# Patient Record
Sex: Male | Born: 1985 | Race: White | Hispanic: No | Marital: Single | Smoking: Current every day smoker
Health system: Southern US, Community
[De-identification: ages and names within clinical notes are randomized; demographics above are authoritative.]

## PROBLEM LIST (undated history)

## (undated) DIAGNOSIS — F445 Conversion disorder with seizures or convulsions: Secondary | ICD-10-CM

## (undated) DIAGNOSIS — R569 Unspecified convulsions: Secondary | ICD-10-CM

## (undated) HISTORY — PX: OTHER SURGICAL HISTORY: SHX169

---
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
## 2007-02-22 ENCOUNTER — Emergency Department (HOSPITAL_COMMUNITY): Admission: EM | Admit: 2007-02-22 | Discharge: 2007-02-22 | Payer: Self-pay | Admitting: Emergency Medicine

## 2007-03-05 ENCOUNTER — Emergency Department (HOSPITAL_COMMUNITY): Admission: EM | Admit: 2007-03-05 | Discharge: 2007-03-06 | Payer: Self-pay | Admitting: Emergency Medicine

## 2007-03-12 ENCOUNTER — Emergency Department (HOSPITAL_COMMUNITY): Admission: EM | Admit: 2007-03-12 | Discharge: 2007-03-12 | Payer: Self-pay | Admitting: Emergency Medicine

## 2007-04-24 ENCOUNTER — Emergency Department (HOSPITAL_COMMUNITY): Admission: EM | Admit: 2007-04-24 | Discharge: 2007-04-24 | Payer: Self-pay | Admitting: Emergency Medicine

## 2009-06-28 ENCOUNTER — Emergency Department (HOSPITAL_COMMUNITY): Admission: EM | Admit: 2009-06-28 | Discharge: 2009-06-28 | Payer: Self-pay | Admitting: Emergency Medicine

## 2009-07-13 ENCOUNTER — Emergency Department: Payer: Self-pay | Admitting: Emergency Medicine

## 2009-08-29 ENCOUNTER — Emergency Department (HOSPITAL_COMMUNITY): Admission: EM | Admit: 2009-08-29 | Discharge: 2009-08-29 | Payer: Self-pay | Admitting: Emergency Medicine

## 2009-09-15 ENCOUNTER — Emergency Department (HOSPITAL_COMMUNITY): Admission: EM | Admit: 2009-09-15 | Discharge: 2009-09-16 | Payer: Self-pay | Admitting: Emergency Medicine

## 2009-09-16 ENCOUNTER — Emergency Department (HOSPITAL_COMMUNITY): Admission: EM | Admit: 2009-09-16 | Discharge: 2009-09-16 | Payer: Self-pay | Admitting: Emergency Medicine

## 2009-10-11 ENCOUNTER — Emergency Department: Payer: Self-pay | Admitting: Emergency Medicine

## 2009-10-12 ENCOUNTER — Emergency Department: Payer: Self-pay | Admitting: Emergency Medicine

## 2009-10-20 ENCOUNTER — Emergency Department (HOSPITAL_COMMUNITY): Admission: EM | Admit: 2009-10-20 | Discharge: 2009-10-20 | Payer: Self-pay | Admitting: Emergency Medicine

## 2009-10-22 ENCOUNTER — Emergency Department (HOSPITAL_COMMUNITY): Admission: EM | Admit: 2009-10-22 | Discharge: 2009-10-22 | Payer: Self-pay | Admitting: Emergency Medicine

## 2009-12-24 ENCOUNTER — Emergency Department (HOSPITAL_COMMUNITY): Admission: EM | Admit: 2009-12-24 | Discharge: 2009-12-24 | Payer: Self-pay | Admitting: Emergency Medicine

## 2010-01-07 ENCOUNTER — Emergency Department (HOSPITAL_COMMUNITY): Admission: EM | Admit: 2010-01-07 | Discharge: 2010-01-07 | Payer: Self-pay | Admitting: Emergency Medicine

## 2010-01-09 ENCOUNTER — Emergency Department: Payer: Self-pay

## 2010-01-12 ENCOUNTER — Emergency Department (HOSPITAL_COMMUNITY): Admission: EM | Admit: 2010-01-12 | Discharge: 2010-01-12 | Payer: Self-pay | Admitting: Emergency Medicine

## 2010-01-13 ENCOUNTER — Emergency Department (HOSPITAL_COMMUNITY): Admission: EM | Admit: 2010-01-13 | Discharge: 2010-01-13 | Payer: Self-pay | Admitting: Emergency Medicine

## 2010-01-17 ENCOUNTER — Ambulatory Visit: Payer: Self-pay | Admitting: Diagnostic Radiology

## 2010-01-17 ENCOUNTER — Emergency Department (HOSPITAL_BASED_OUTPATIENT_CLINIC_OR_DEPARTMENT_OTHER): Admission: EM | Admit: 2010-01-17 | Discharge: 2010-01-17 | Payer: Self-pay | Admitting: Emergency Medicine

## 2010-01-31 ENCOUNTER — Emergency Department (HOSPITAL_COMMUNITY): Admission: EM | Admit: 2010-01-31 | Discharge: 2010-01-31 | Payer: Self-pay | Admitting: Emergency Medicine

## 2010-02-07 ENCOUNTER — Emergency Department (HOSPITAL_COMMUNITY): Admission: EM | Admit: 2010-02-07 | Discharge: 2010-02-07 | Payer: Self-pay | Admitting: Emergency Medicine

## 2010-02-22 ENCOUNTER — Emergency Department (HOSPITAL_COMMUNITY): Admission: EM | Admit: 2010-02-22 | Discharge: 2010-02-22 | Payer: Self-pay | Admitting: Emergency Medicine

## 2010-03-11 ENCOUNTER — Emergency Department (HOSPITAL_COMMUNITY): Admission: EM | Admit: 2010-03-11 | Discharge: 2010-03-11 | Payer: Self-pay | Admitting: Emergency Medicine

## 2010-03-22 ENCOUNTER — Emergency Department (HOSPITAL_COMMUNITY): Admission: EM | Admit: 2010-03-22 | Discharge: 2010-03-22 | Payer: Self-pay | Admitting: Emergency Medicine

## 2010-04-27 ENCOUNTER — Emergency Department (HOSPITAL_COMMUNITY): Admission: EM | Admit: 2010-04-27 | Discharge: 2010-04-27 | Payer: Self-pay | Admitting: Emergency Medicine

## 2010-05-06 ENCOUNTER — Emergency Department (HOSPITAL_COMMUNITY): Admission: EM | Admit: 2010-05-06 | Discharge: 2010-05-06 | Payer: Self-pay | Admitting: Emergency Medicine

## 2010-05-31 ENCOUNTER — Emergency Department: Payer: Self-pay | Admitting: Emergency Medicine

## 2010-08-17 IMAGING — CR DG SHOULDER 2+V*L*
3 series · 3 of 3 positions shown · non-contrast
Comparison: Chest left shoulder films 08/29/2009 and 06/28/2009.

CLINICAL DATA: Status post fall.  Unable to move arm.  Pain.

LEFT SHOULDER - 2+ VIEW

[w shoulder ap internal left *]
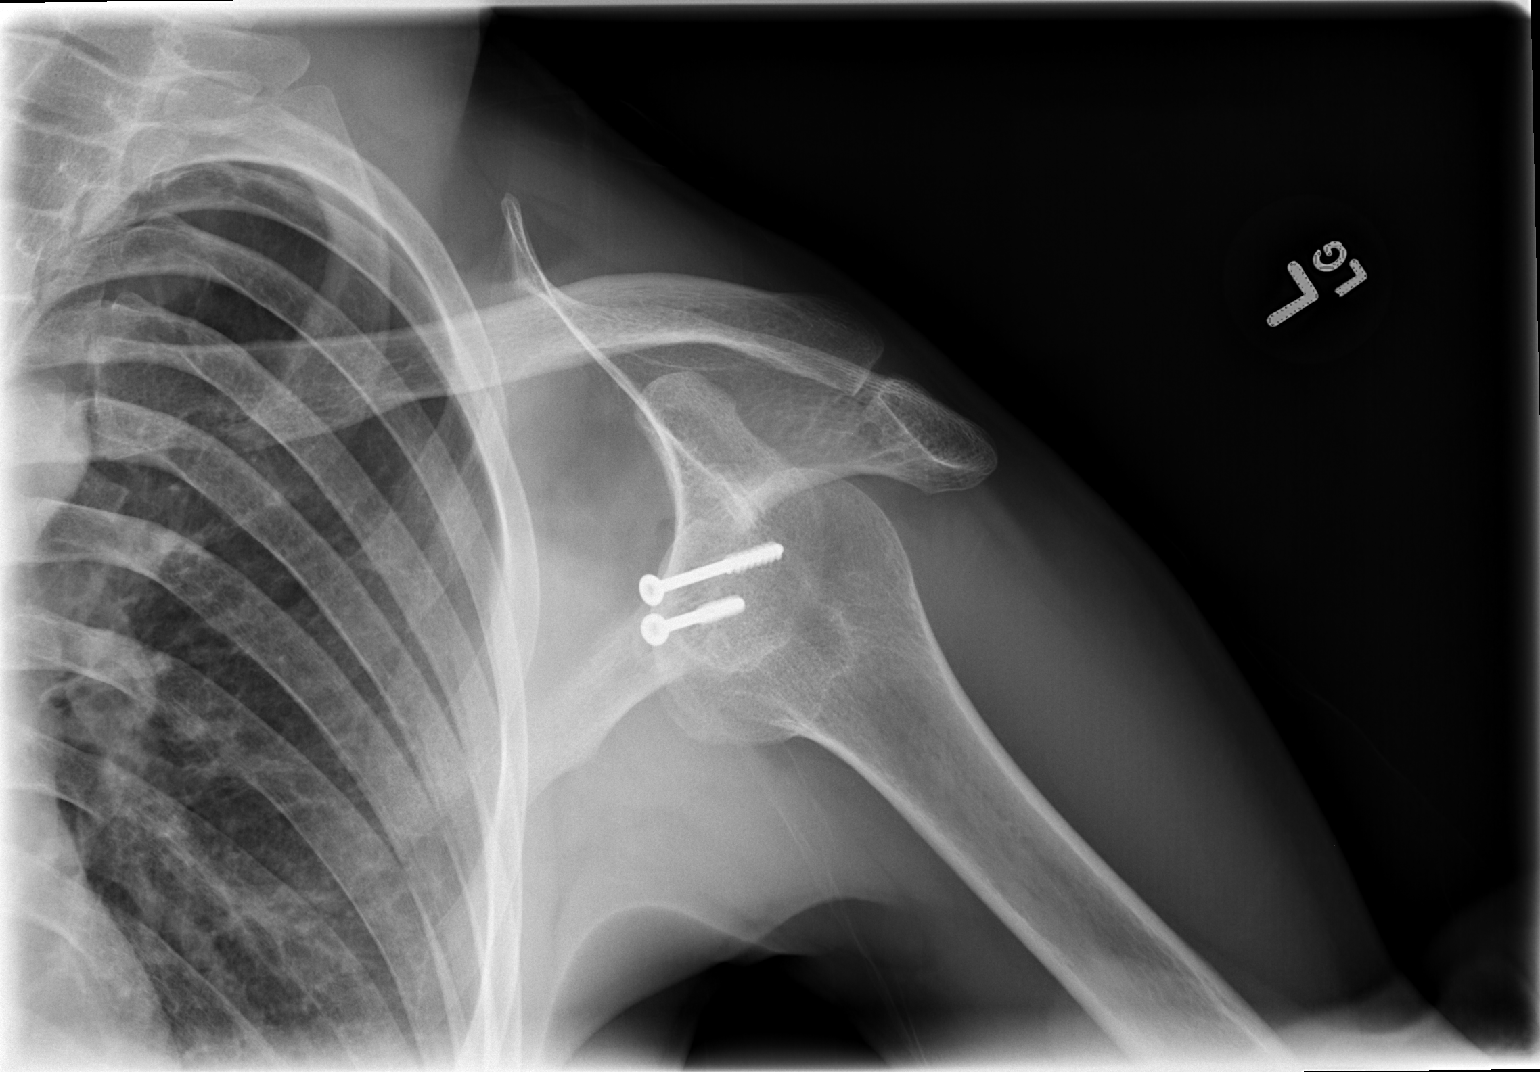

[w shoulder ap external left (1 of 2)]
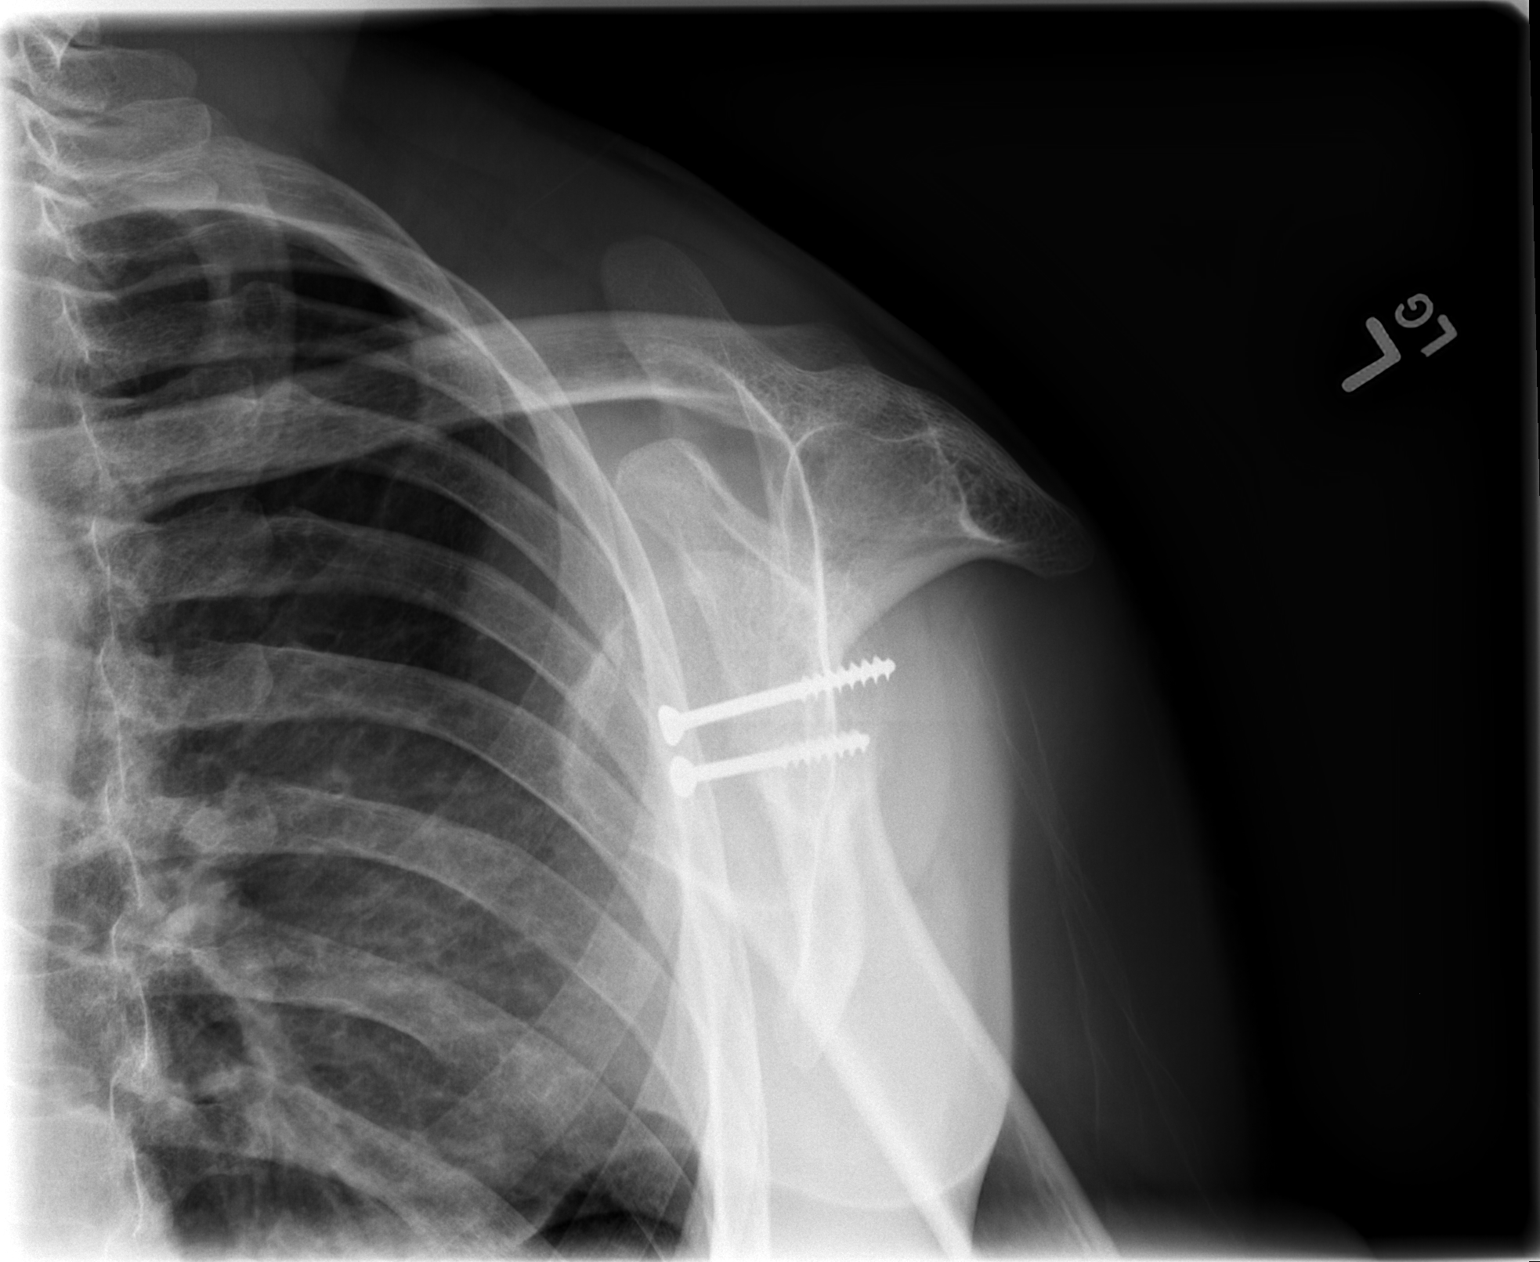

[w shoulder ap external left (2 of 2)]
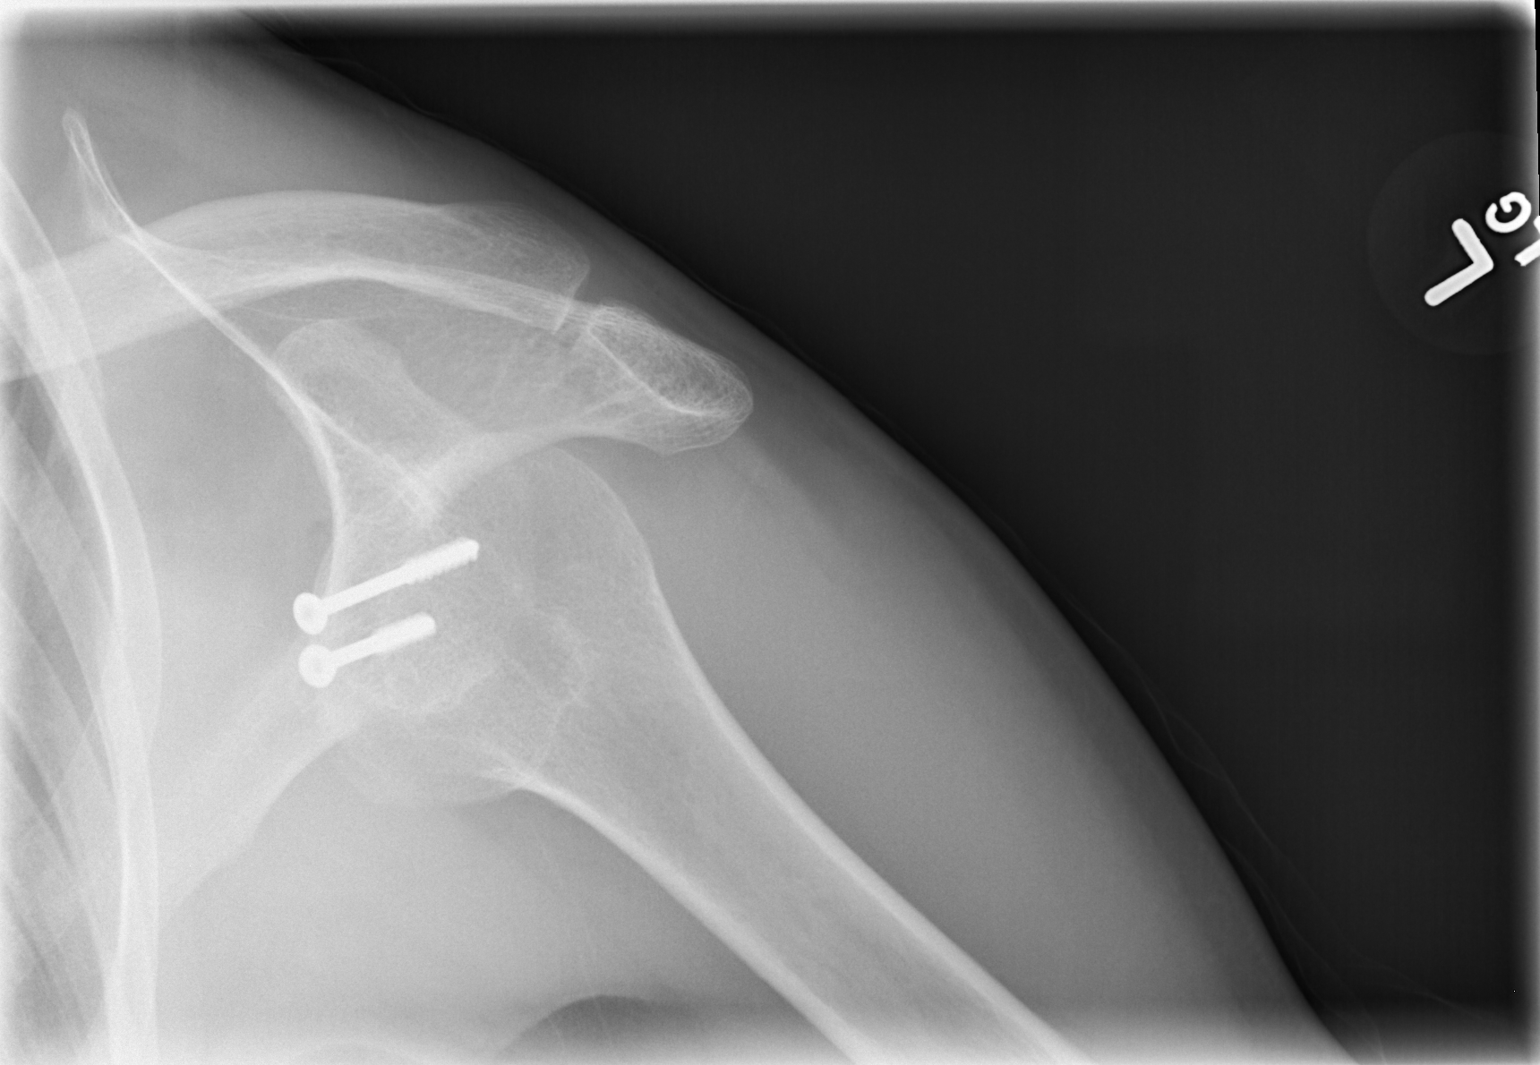

[3 of 3 positions shown; findings below may reference images not displayed]

FINDINGS: The left humerus is dislocated anteriorly, similar to the
prior films.  No acute fracture is evident.  A previously noted
Hill-Sachs fracture is not well seen on these views.  Postoperative
changes are again noted.
IMPRESSION: 1.  Anterior dislocation.

## 2010-08-31 ENCOUNTER — Emergency Department (HOSPITAL_COMMUNITY): Admission: EM | Admit: 2010-08-31 | Discharge: 2010-02-28 | Payer: Self-pay | Admitting: Emergency Medicine

## 2010-08-31 ENCOUNTER — Emergency Department (HOSPITAL_COMMUNITY): Admission: EM | Admit: 2010-08-31 | Discharge: 2009-11-21 | Payer: Self-pay | Admitting: Internal Medicine

## 2010-12-08 IMAGING — CR DG SHOULDER 2+V*L*
4 series · 4 of 4 positions shown · non-contrast
Comparison: Left shoulder x-rays 12/24/2009, 11/21/2009,
10/22/2009, 10/20/2009, 09/16/2009, 06/28/2009, and 04/24/2007.

CLINICAL DATA: Fell out of bed, injured left shoulder.

LEFT SHOULDER - 2+ VIEW 01/07/2010:

[w shoulder ap internal left]
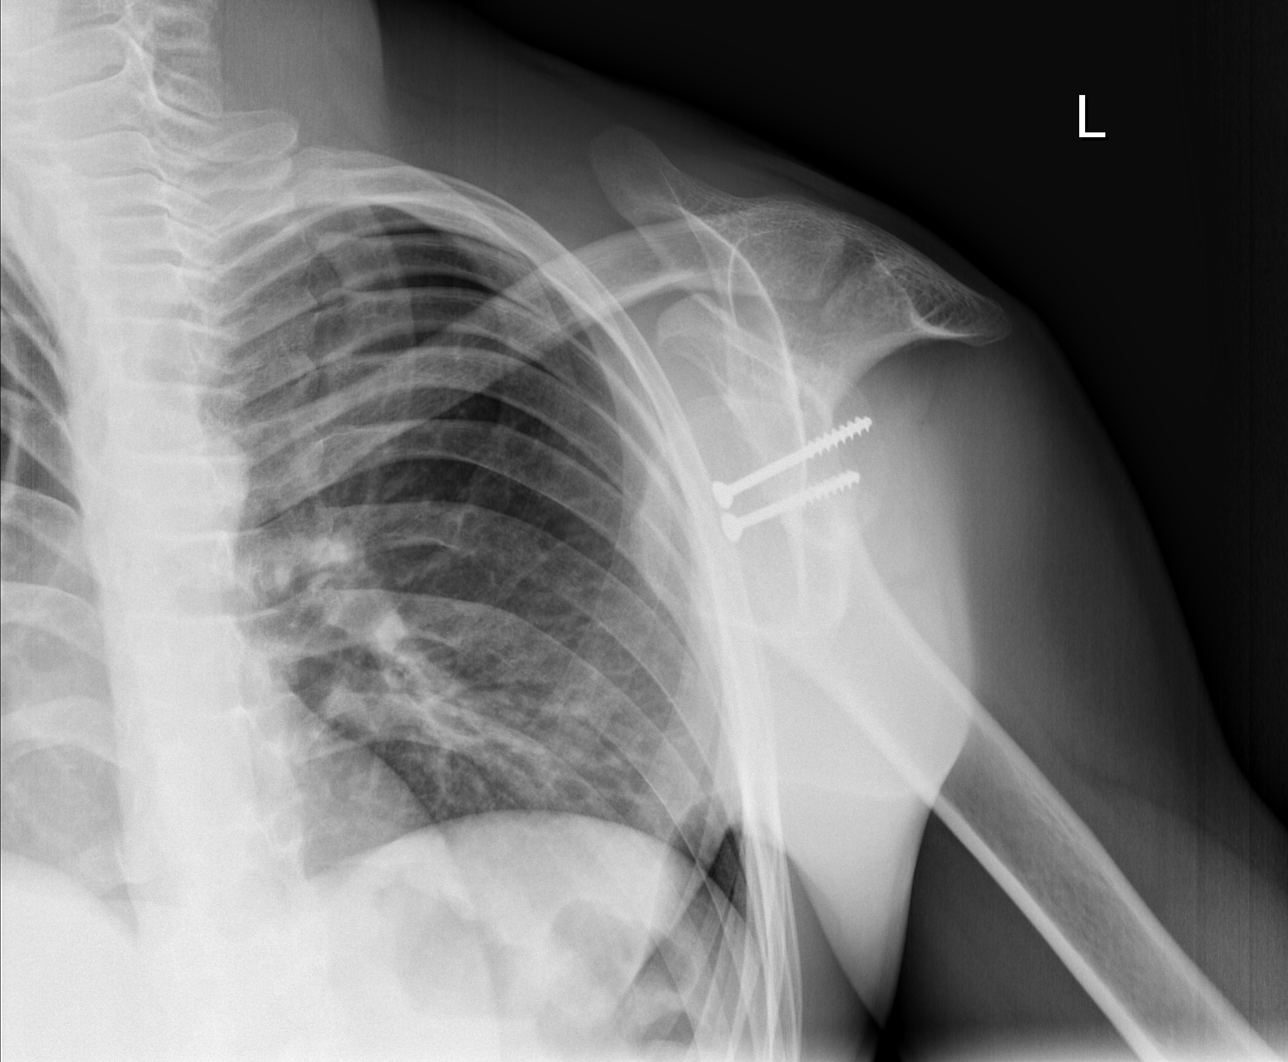

[w shoulder ap external left]
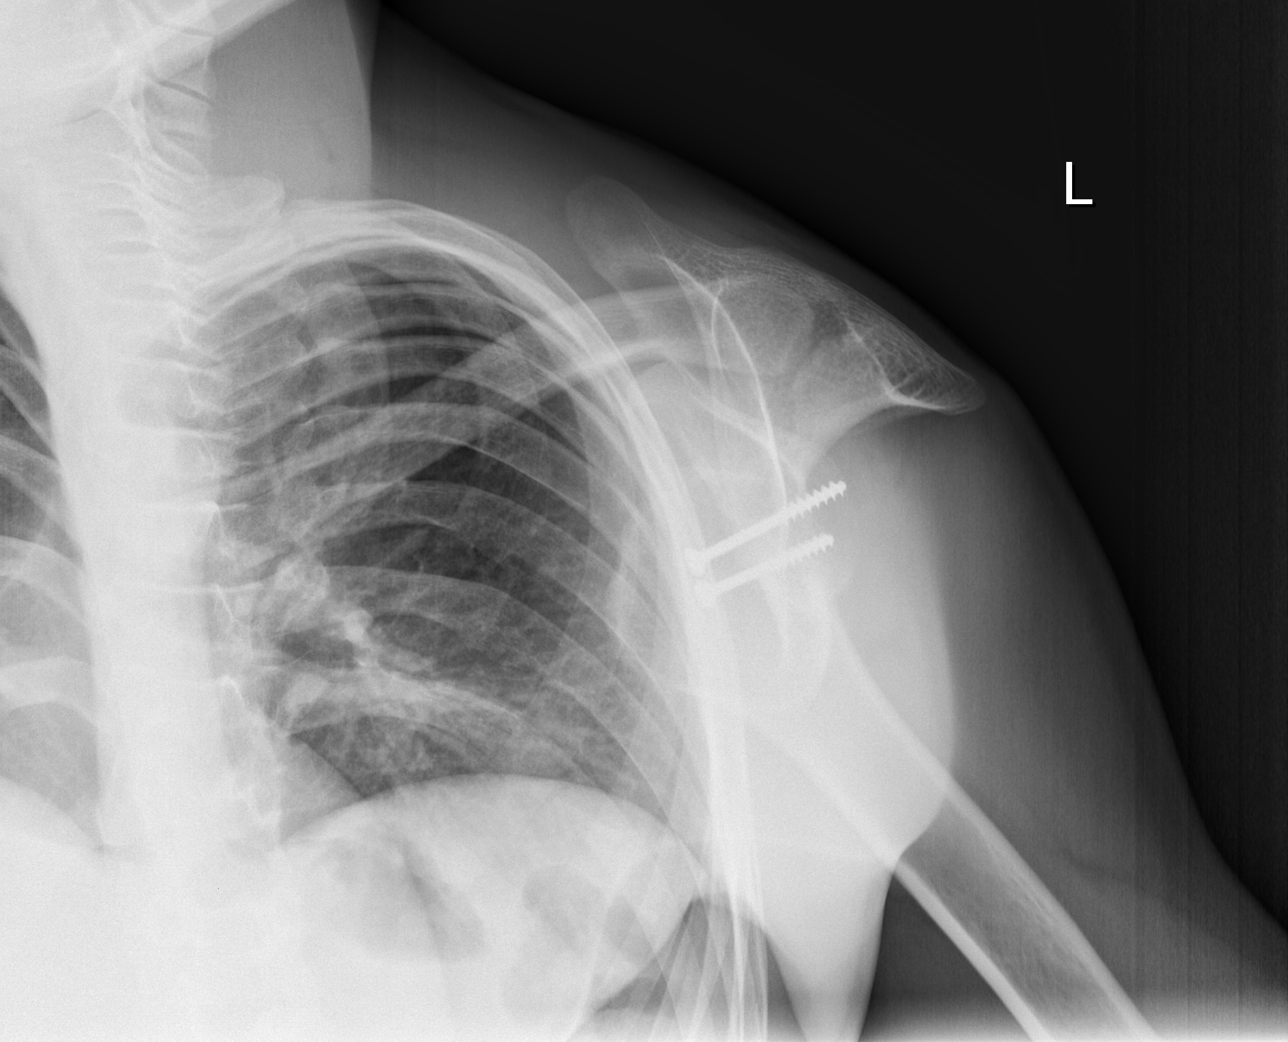

[w shoulder y view left]
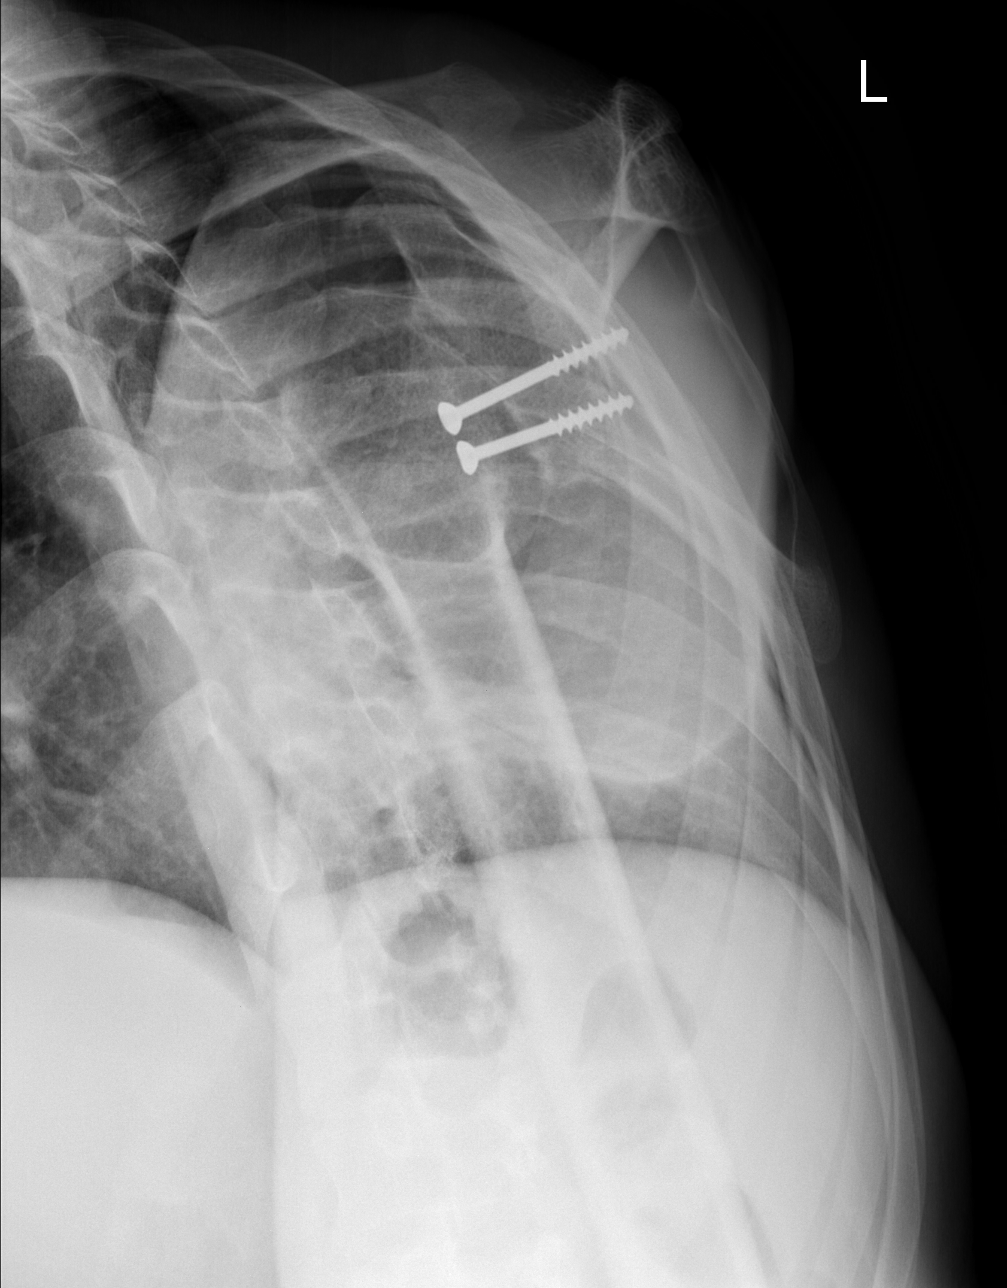

[x shoulder axillary left]
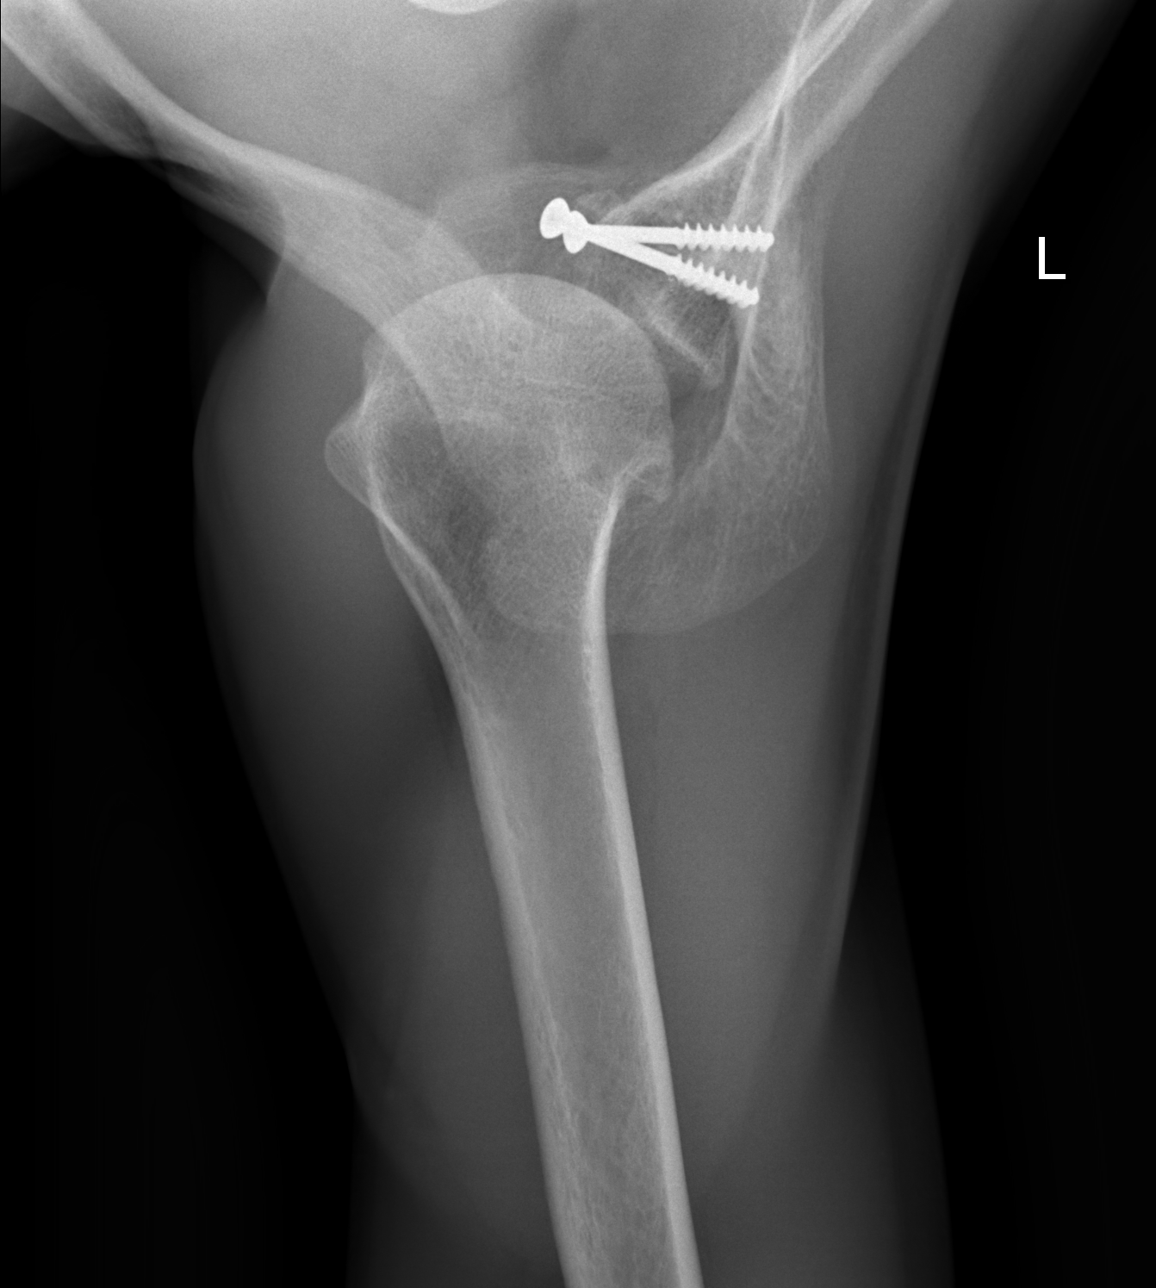

[4 of 4 positions shown; findings below may reference images not displayed]

FINDINGS: Recurrent anterior glenohumeral dislocation.  Compression
screws within the scapula at the glenoid.  No visible acute
fractures.  Acromioclavicular joint intact.
IMPRESSION: Recurrent anterior glenohumeral dislocation.  No visible acute
fractures.

## 2010-12-08 IMAGING — CR DG SHOULDER 1V*L*
2 series · 2 of 2 positions shown · non-contrast
Comparison: Left shoulder x-rays earlier this morning 2525 hours.

CLINICAL DATA: Post-reduction of the left glenohumeral joint.

PORTABLE LEFT SHOULDER 2 VIEWS [DATE]/3511 5455 hours:

[view not recorded (1 of 2)]
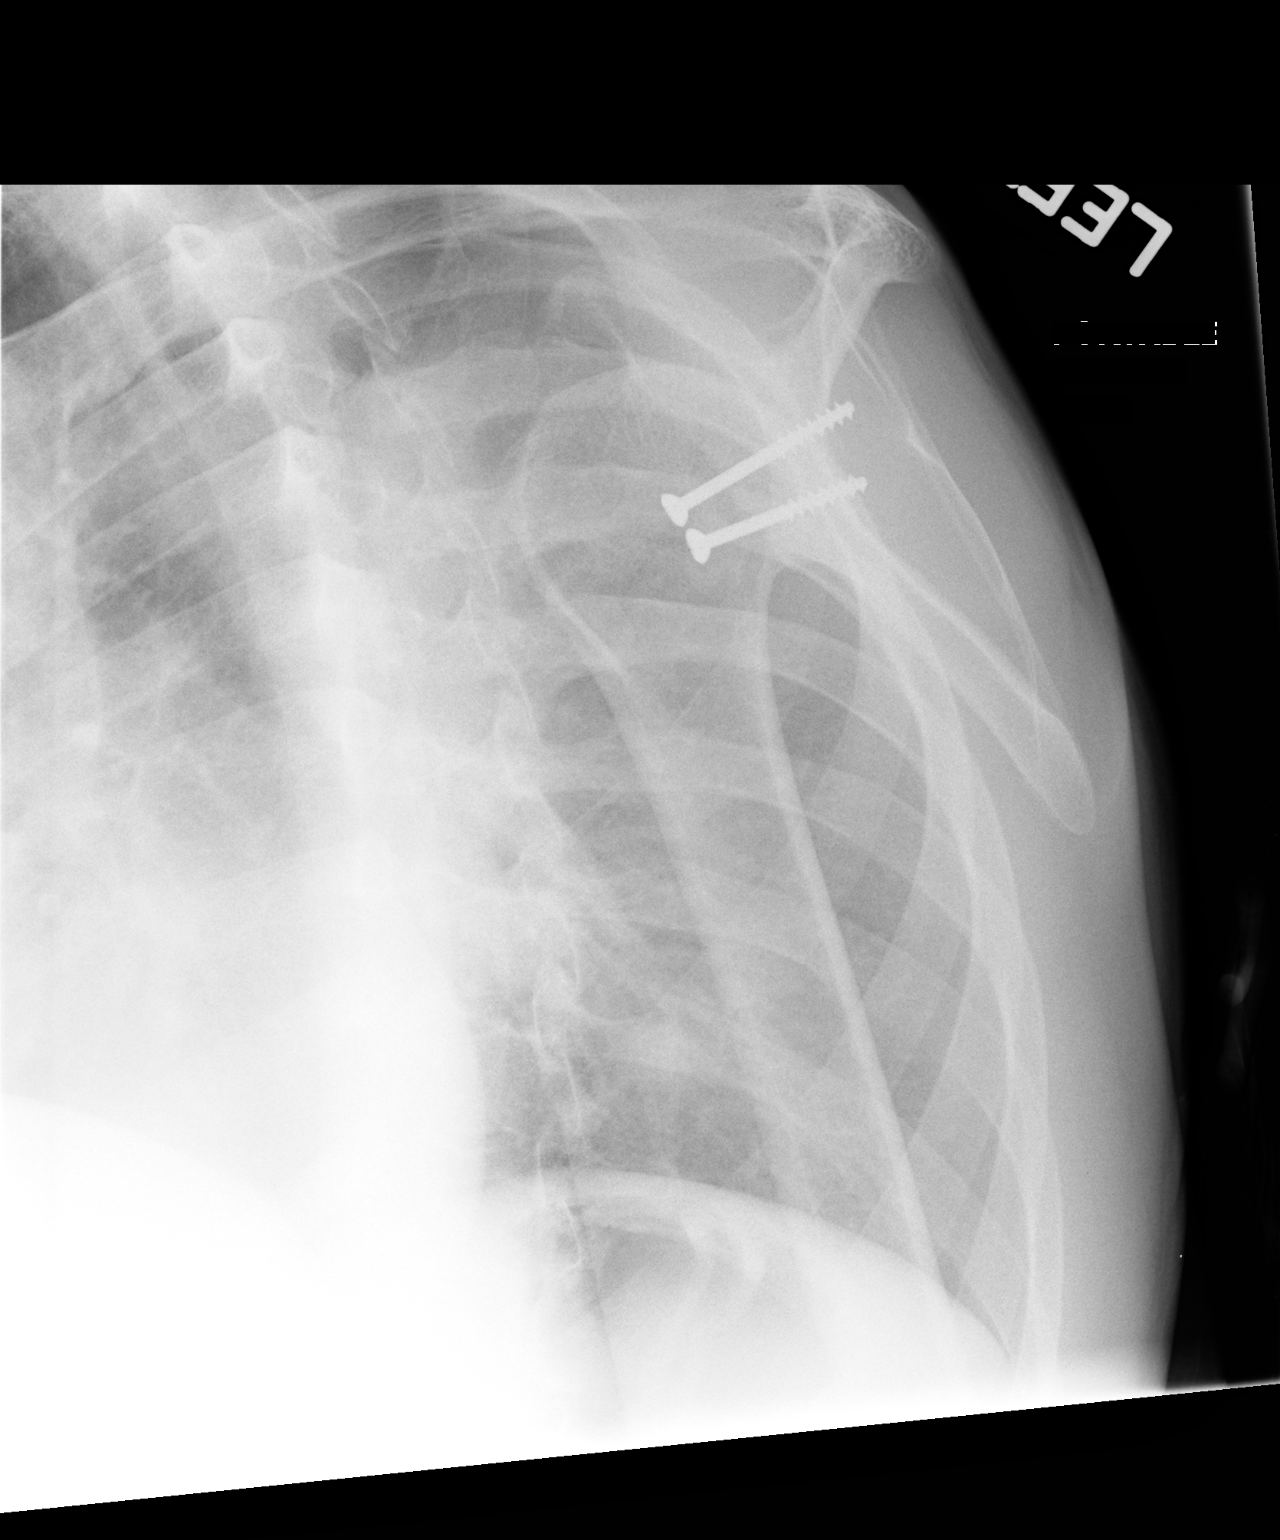

[view not recorded (2 of 2)]
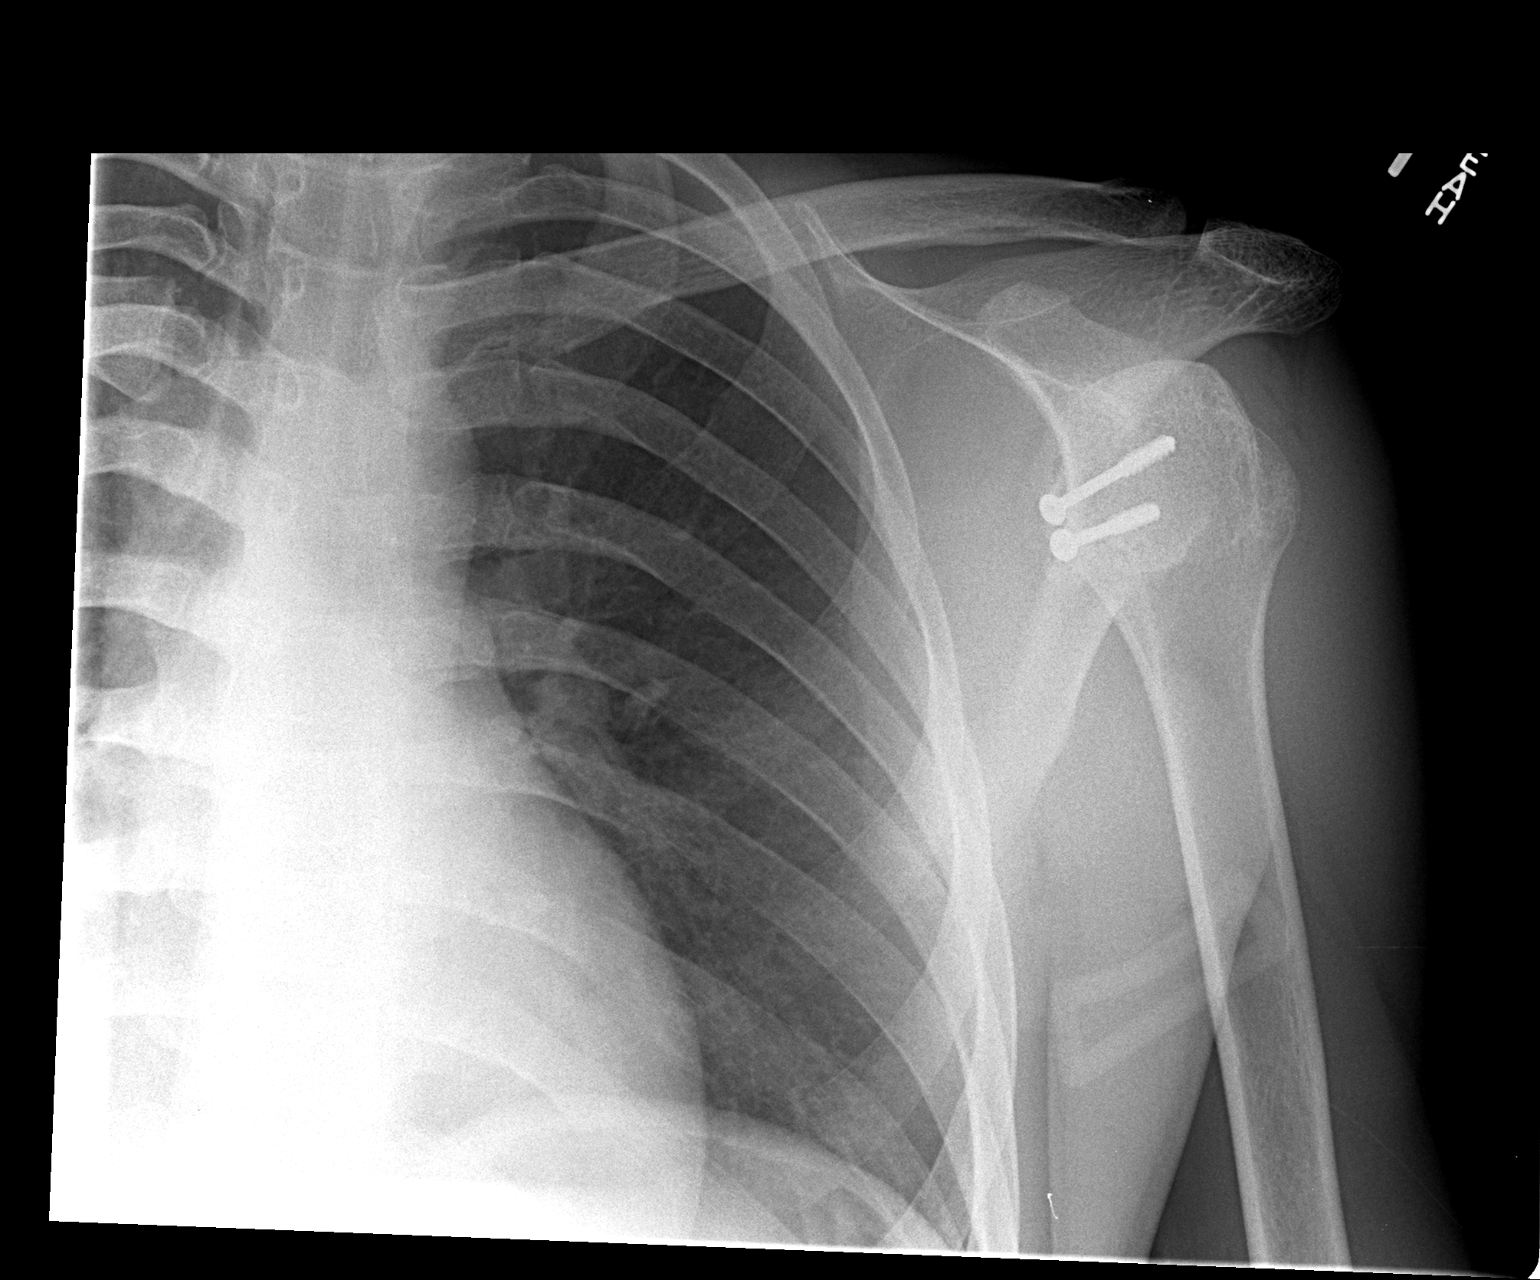

[2 of 2 positions shown; findings below may reference images not displayed]

FINDINGS: Anatomic alignment of the glenohumeral joint post-
reduction.  Hill-Sachs deformity involving the humeral head,
unchanged from the more remote examinations compared earlier.
IMPRESSION: Anatomic alignment of the left glenohumeral joint post-reduction.

## 2010-12-10 LAB — RAPID URINE DRUG SCREEN, HOSP PERFORMED
Cocaine: NOT DETECTED
Opiates: NOT DETECTED

## 2016-08-07 ENCOUNTER — Emergency Department (HOSPITAL_COMMUNITY)
Admission: EM | Admit: 2016-08-07 | Discharge: 2016-08-07 | Disposition: A | Discharge status: Home / Self Care | Payer: Self-pay | Attending: Physician Assistant | Admitting: Physician Assistant

## 2016-08-07 ENCOUNTER — Encounter (HOSPITAL_COMMUNITY): Payer: Self-pay | Admitting: Emergency Medicine

## 2016-08-07 ENCOUNTER — Emergency Department (HOSPITAL_COMMUNITY): Payer: Self-pay

## 2016-08-07 DIAGNOSIS — Y929 Unspecified place or not applicable: Secondary | ICD-10-CM | POA: Insufficient documentation

## 2016-08-07 DIAGNOSIS — M25512 Pain in left shoulder: Secondary | ICD-10-CM | POA: Insufficient documentation

## 2016-08-07 DIAGNOSIS — F17210 Nicotine dependence, cigarettes, uncomplicated: Secondary | ICD-10-CM | POA: Insufficient documentation

## 2016-08-07 DIAGNOSIS — W1839XA Other fall on same level, initial encounter: Secondary | ICD-10-CM | POA: Insufficient documentation

## 2016-08-07 DIAGNOSIS — Y9389 Activity, other specified: Secondary | ICD-10-CM | POA: Insufficient documentation

## 2016-08-07 DIAGNOSIS — Y999 Unspecified external cause status: Secondary | ICD-10-CM | POA: Insufficient documentation

## 2016-08-07 HISTORY — DX: Unspecified convulsions: R56.9

## 2016-08-07 HISTORY — DX: Conversion disorder with seizures or convulsions: F44.5

## 2016-08-07 MED ORDER — HYDROMORPHONE HCL 1 MG/ML IJ SOLN: 1.0000 mg | Freq: Once | INTRAMUSCULAR | Status: DC

## 2016-08-07 MED ORDER — HYDROCODONE-ACETAMINOPHEN 5-325 MG PO TABS
1.0000 | ORAL_TABLET | Freq: Once | ORAL | Status: AC
  Administered 2016-08-07: 1 via ORAL
  Filled 2016-08-07: qty 1

## 2016-08-07 NOTE — Discharge Instructions (Signed)
X-ray was normal today. Please wear the sling for comfort. He may take Tylenol and ibuprofen for pain. Follow-up with orthopedics if symptoms persist.

## 2016-08-07 NOTE — ED Triage Notes (Signed)
[facility] pt was standing on a box and fell off, landing on his left side; pt has hx of left shoulder dislocations and had surgery on shoulder in 2010; deformity noted

## 2016-08-07 NOTE — ED Provider Notes (Signed)
[facility]-EMERGENCY DEPT Provider Note   CSN: [id number] Arrival date & time: 08/07/16  2035     History   Chief Complaint Chief Complaint  Patient presents with  . Shoulder Injury    HPI Eric Church is a 30 y.o. male.  30 year old Caucasian male with a past medical history of pseudoseizures and chronic left shoulder pain that presents to the ED today with left shoulder pain. [facility]. Patient states he was standing on a box and fell off landing on his left shoulder pta. The patient had surgery on his frequent dislocations in 2010. [facility] for fall and left shoulder that showed chronic changes without any acute dislocation or fracture. The patient endorses pain at this time and left shoulder. States the pain was acute in onset. Is gradually worsening. Moving makes the pain worse. Nothing makes the pain better. He has not tried anything for the pain prior to arrival. Denies hitting his head. Denies loc. Patient denies any other symptoms or complaints at this time. Patient states he has chronic left shoulder problems and had dislocations and had surgery back in 2010. Since that time he had failure of one of the screws and has a broken screw in a shoulder. Patient relates severe pain at this point time and worse with range of motion      Past Medical History:  Diagnosis Date  . Pseudoseizures     There are no active problems to display for this patient.   Past Surgical History:  Procedure Laterality Date  . left shoulder surgery         Home Medications    Prior to Admission medications   Medication Sig Start Date End Date Taking? Authorizing Provider  cloNIDine (CATAPRES) 0.1 MG tablet Take 0.1 mg by mouth daily. Started 11/10 for 4 days   Yes Historical Provider, MD  ibuprofen (ADVIL,MOTRIN) 400 MG tablet Take 400 mg by mouth 2 (two) times daily. Started 11/12 for  7 days   Yes Historical Provider, MD  levETIRAcetam (KEPPRA) 500 MG tablet Take 500 mg by mouth 2 (two) times daily. Started 11/09 for 14 days   Yes Historical Provider, MD  mirtazapine (REMERON) 15 MG tablet Take 7.5 mg by mouth every evening.    Yes Historical Provider, MD  thiamine (VITAMIN B-1) 100 MG tablet Take 100 mg by mouth daily.   Yes Historical Provider, MD    Family History No family history on file.  Social History Social History  Substance Use Topics  . Smoking status: Current Every Day Smoker    Packs/day: 1.00    Types: Cigarettes  . Smokeless tobacco: Former NeurosurgeonUser  . Alcohol use Yes     Comment: 2 drinks per week     Allergies   Morphine and related   Review of Systems Review of Systems  Constitutional: Negative for chills and fever.  HENT: Negative for congestion, ear pain, rhinorrhea and sore throat.   Eyes: Negative for pain and discharge.  Respiratory: Negative for cough and shortness of breath.   Cardiovascular: Negative for chest pain and palpitations.  Gastrointestinal: Negative for abdominal pain, diarrhea, nausea and vomiting.  Genitourinary: Negative for flank pain, frequency, hematuria and urgency.  Musculoskeletal: Positive for arthralgias. Negative for myalgias and neck pain.  Skin: Negative.   Neurological: Negative for dizziness, syncope, weakness, light-headedness, numbness and headaches.  All other systems reviewed and are negative.  Physical Exam Updated Vital Signs BP (!) 134/109 (BP Location: Right Arm)   Pulse 80   Temp 98.2 F (36.8 C) (Oral)   Resp 17   Ht 5\' 7"  (1.702 m)   Wt 63.5 kg   SpO2 100%   BMI 21.93 kg/m   Physical Exam  Constitutional: He appears well-developed and well-nourished. No distress.  HENT:  Head: Normocephalic and atraumatic.  Mouth/Throat: Oropharynx is clear and moist.  Eyes: Conjunctivae are normal. Right eye exhibits no discharge. Left eye exhibits no discharge. No scleral icterus.  Neck:  Normal range of motion. Neck supple. No thyromegaly present.  Full ROM, no midline tenderness.  Cardiovascular: Normal rate, regular rhythm, normal heart sounds and intact distal pulses.   Pulmonary/Chest: Effort normal and breath sounds normal.  Abdominal: Soft. Bowel sounds are normal. He exhibits no distension. There is no tenderness.  Musculoskeletal:       Left shoulder: He exhibits decreased range of motion (Secondary to pain), tenderness, bony tenderness, deformity (chronic appearing) and pain. He exhibits no swelling, no effusion, no crepitus, normal pulse and normal strength.  Clear to assess range of motion due to pain. Radial pulses are 2+. Sensation intact. Cap refill normal. Surgical scar noted to left shoulder. Deformity is noted.  Lymphadenopathy:    He has no cervical adenopathy.  Neurological: He is alert.  Skin: Skin is warm and dry. Capillary refill takes less than 2 seconds.  Nursing note and vitals reviewed.    ED Treatments / Results  Labs (all labs ordered are listed, but only abnormal results are displayed) Labs Reviewed - No data to display  EKG  EKG Interpretation None       Radiology Dg Shoulder Left  Result Date: 08/07/2016 CLINICAL DATA:  Status post fall off box, landing on left side. Left shoulder pain. Initial encounter. EXAM: LEFT SHOULDER - 2+ VIEW COMPARISON:  Left shoulder radiographs performed 08/02/2016 FINDINGS: There is no evidence of fracture or dislocation. There appears to mild anterior subluxation of the left humeral head. There is mild chronic flattening of the left humeral head, with osteophyte formation. There is mild chronic deformity of the left glenoid, with associated postoperative change. There is a chronic fracture of one of the screws at the left glenoid. The left humeral head is seated within the glenoid fossa. The acromioclavicular joint is unremarkable in appearance. No significant soft tissue abnormalities are seen. The  visualized portions of the left lung are clear. IMPRESSION: 1. Apparent mild chronic anterior subluxation of the left humeral head. No evidence of fracture or dislocation. 2. Mild chronic flattening of the left humeral head, with osteophyte formation. Mild chronic deformity of the left glenoid. Chronic stable fracture of one of the screws of the left glenoid. Electronically Signed   By: Roanna RaiderJeffery  Chang M.D.   On: 08/07/2016 21:21    Procedures Procedures (including critical care time)  Medications Ordered in ED Medications  HYDROcodone-acetaminophen (NORCO/VICODIN) 5-325 MG per tablet 1 tablet (1 tablet Oral Given 08/07/16 2230)     Initial Impression / Assessment and Plan / ED Course  I have reviewed the triage vital signs and the nursing notes.  Pertinent labs & imaging results that were available during my care of the patient were reviewed by me and considered in my medical decision making (see chart for details).  Clinical Course   Patient X-Ray negative for obvious fracture or dislocation.X-ray shows chronic changes but no acute dislocation or fracture. [facility] with left shoulder pain with similar x-ray findings.  Pain managed in ED. Pt advised to follow up with orthopedics if symptoms persist for possibility of missed fracture diagnosis. Patient given brace while in ED, conservative therapy recommended and discussed. Patient will be dc back to jail with officers & is agreeable with above plan.   Final Clinical Impressions(s) / ED Diagnoses   Final diagnoses:  Acute pain of left shoulder    New Prescriptions New Prescriptions   No medications on file     Rise Mu, PA-C 08/08/16 1610    Courteney Randall An, MD 08/08/16 1918
